# Patient Record
Sex: Female | Born: 1955
Health system: Southern US, Community
[De-identification: ages and names within clinical notes are randomized; demographics above are authoritative.]

## PROBLEM LIST (undated history)

## (undated) DIAGNOSIS — Q782 Osteopetrosis: Secondary | ICD-10-CM

## (undated) DIAGNOSIS — I10 Essential (primary) hypertension: Secondary | ICD-10-CM

## (undated) HISTORY — DX: Osteopetrosis: Q78.2

## (undated) HISTORY — DX: Essential (primary) hypertension: I10

## (undated) HISTORY — PX: TUBAL LIGATION: SHX77

## (undated) HISTORY — PX: BREAST ENHANCEMENT SURGERY: SHX7

---

## 1997-10-31 ENCOUNTER — Other Ambulatory Visit: Admission: RE | Admit: 1997-10-31 | Discharge: 1997-10-31 | Payer: Self-pay | Admitting: Gynecology

## 1997-11-28 ENCOUNTER — Other Ambulatory Visit: Admission: RE | Admit: 1997-11-28 | Discharge: 1997-11-28 | Payer: Self-pay | Admitting: Gynecology

## 1998-11-27 ENCOUNTER — Other Ambulatory Visit: Admission: RE | Admit: 1998-11-27 | Discharge: 1998-11-27 | Payer: Self-pay | Admitting: Gynecology

## 1999-12-01 ENCOUNTER — Other Ambulatory Visit: Admission: RE | Admit: 1999-12-01 | Discharge: 1999-12-01 | Payer: Self-pay | Admitting: Gynecology

## 2000-12-01 ENCOUNTER — Other Ambulatory Visit: Admission: RE | Admit: 2000-12-01 | Discharge: 2000-12-01 | Payer: Self-pay | Admitting: Gynecology

## 2001-12-11 ENCOUNTER — Other Ambulatory Visit: Admission: RE | Admit: 2001-12-11 | Discharge: 2001-12-11 | Payer: Self-pay | Admitting: Gynecology

## 2002-12-11 ENCOUNTER — Other Ambulatory Visit: Admission: RE | Admit: 2002-12-11 | Discharge: 2002-12-11 | Payer: Self-pay | Admitting: Gynecology

## 2004-01-09 ENCOUNTER — Other Ambulatory Visit: Admission: RE | Admit: 2004-01-09 | Discharge: 2004-01-09 | Payer: Self-pay | Admitting: Gynecology

## 2005-01-19 ENCOUNTER — Other Ambulatory Visit: Admission: RE | Admit: 2005-01-19 | Discharge: 2005-01-19 | Payer: Self-pay | Admitting: Gynecology

## 2006-02-11 ENCOUNTER — Ambulatory Visit (HOSPITAL_COMMUNITY): Admission: RE | Admit: 2006-02-11 | Discharge: 2006-02-11 | Payer: Self-pay | Admitting: Gynecology

## 2011-01-14 ENCOUNTER — Other Ambulatory Visit: Payer: Self-pay | Admitting: Gynecology

## 2013-09-11 ENCOUNTER — Ambulatory Visit (INDEPENDENT_AMBULATORY_CARE_PROVIDER_SITE_OTHER): Payer: BC Managed Care – PPO | Admitting: Podiatrist

## 2013-09-11 ENCOUNTER — Encounter: Payer: Self-pay | Admitting: Podiatrist

## 2013-09-11 ENCOUNTER — Ambulatory Visit (INDEPENDENT_AMBULATORY_CARE_PROVIDER_SITE_OTHER): Payer: BC Managed Care – PPO

## 2013-09-11 VITALS — BP 124/80 | HR 68 | Resp 18

## 2013-09-11 DIAGNOSIS — M722 Plantar fascial fibromatosis: Secondary | ICD-10-CM

## 2013-09-11 MED ORDER — TRIAMCINOLONE ACETONIDE 10 MG/ML IJ SUSP
10.0000 mg | Freq: Once | INTRAMUSCULAR | Status: AC
  Administered 2013-09-11: 10 mg

## 2013-09-11 NOTE — Patient Instructions (Signed)

## 2013-09-11 NOTE — Progress Notes (Signed)
   Subjective:    Patient ID: Yesenia Torres, female    DOB: Aug 27, 1955, 58 y.o.   MRN: [id number]  HPI I AM HAVING SOME HEEL PAIN ON MY LEFT FOOT AND HAS BEEN GOING ON FOR ABOUT A YEAR AND I WENT TO THE DOCTOR AND THEY DID INJECTIONS AND HAD TWO INJECTIONS AND THEY GAVE ME PRENISONE AND WORE A BOOT FOR ABOUT 5 MONTHS AND I WAS GIVEN MOBIC AND SEEMED TO HELP SOME     Review of Systems  All other systems reviewed and are negative.      Objective:   Physical Exam GENERAL APPEARANCE: Alert, conversant. Appropriately groomed. No acute distress.  VASCULAR: Pedal pulses palpable and strong bilateral.  Capillary refill time is immediate to all digits,  Proximal to distal cooling it warm to warm.  Digital hair growth is present bilateral  NEUROLOGIC: sensation is intact epicritically and protectively to 5.07 monofilament at 5/5 sites bilateral.  Light touch is intact bilateral, vibratory sensation intact bilateral, achilles tendon reflex is intact bilateral.  Negative Tinel sign is elicited MUSCULOSKELETAL: Pain on palpation plantar medial aspect left foot  at insertion of plantar fascia on the medial calcaneal tubercle. Inflammation at the insertion of the plantar fascia is present. Very thin foot and rectus foot type is seen. DERMATOLOGIC: skin color, texture, and turger are within normal limits.  No preulcerative lesions are seen, no interdigital maceration noted.  No open lesions present.  Digital nails are asymptomatic.     Assessment & Plan:  Plantar fasciitis left  Plan: Patient has tried conservative treatments courtesy of Dr. Gean Quint consisting of steroid injections x2 in September of 2014. She also tried a boot which felt good at the time of wearing it however the plantar fasciitis came back. Today I recommended another cortisone injection and this was carried out under sterile technique. I also recommended custom orthotics as well. We discussed physical therapy if the orthotics are not  beneficial at resolving the plantar fasciitis. I will see her back in one month and we will consider orthotics versus the physical therapy at that time.

## 2013-09-12 ENCOUNTER — Other Ambulatory Visit: Payer: Self-pay | Admitting: Podiatrist

## 2013-09-12 ENCOUNTER — Telehealth: Payer: Self-pay | Admitting: *Deleted

## 2013-09-12 MED ORDER — MELOXICAM 15 MG PO TABS: 15.0000 mg | ORAL_TABLET | Freq: Every day | ORAL | Status: DC

## 2013-09-12 NOTE — Telephone Encounter (Signed)
She saw me yesterday.  Will she be willing to write me a prescription for Meloxicam to go alone with the shot I got yesterday?  Someone give me a call.

## 2013-09-12 NOTE — Telephone Encounter (Signed)
Yes, please call in melixicam if it wasn't already done yesterday when I saw her #30 with 2 refills

## 2013-09-12 NOTE — Addendum Note (Signed)
Addended by: Cranford Mon R on: 09/12/2013 03:36 PM   Modules accepted: Orders

## 2014-07-01 ENCOUNTER — Other Ambulatory Visit: Payer: Self-pay | Admitting: Orthopedic Surgery

## 2014-07-01 DIAGNOSIS — R52 Pain, unspecified: Secondary | ICD-10-CM

## 2014-07-03 ENCOUNTER — Ambulatory Visit
Admission: RE | Admit: 2014-07-03 | Discharge: 2014-07-03 | Disposition: A | Discharge status: Home / Self Care | Payer: BLUE CROSS/BLUE SHIELD | Source: Ambulatory Visit | Attending: Orthopedic Surgery | Admitting: Orthopedic Surgery

## 2014-07-03 DIAGNOSIS — R52 Pain, unspecified: Secondary | ICD-10-CM

## 2014-07-16 ENCOUNTER — Other Ambulatory Visit: Payer: Self-pay | Admitting: Obstetrics & Gynecology

## 2014-07-17 LAB — CYTOLOGY - PAP

## 2014-08-13 ENCOUNTER — Other Ambulatory Visit: Payer: Self-pay | Admitting: Obstetrics & Gynecology

## 2017-06-06 ENCOUNTER — Encounter: Payer: Self-pay | Admitting: Gastroenterology

## 2017-10-10 DIAGNOSIS — Z0000 Encounter for general adult medical examination without abnormal findings: Secondary | ICD-10-CM | POA: Diagnosis not present

## 2017-10-10 DIAGNOSIS — Z01419 Encounter for gynecological examination (general) (routine) without abnormal findings: Secondary | ICD-10-CM | POA: Diagnosis not present

## 2017-10-10 DIAGNOSIS — Z1231 Encounter for screening mammogram for malignant neoplasm of breast: Secondary | ICD-10-CM | POA: Diagnosis not present

## 2017-10-10 DIAGNOSIS — Z6826 Body mass index (BMI) 26.0-26.9, adult: Secondary | ICD-10-CM | POA: Diagnosis not present

## 2017-11-30 DIAGNOSIS — L578 Other skin changes due to chronic exposure to nonionizing radiation: Secondary | ICD-10-CM | POA: Diagnosis not present

## 2017-11-30 DIAGNOSIS — L821 Other seborrheic keratosis: Secondary | ICD-10-CM | POA: Diagnosis not present

## 2017-11-30 DIAGNOSIS — C44719 Basal cell carcinoma of skin of left lower limb, including hip: Secondary | ICD-10-CM | POA: Diagnosis not present

## 2017-11-30 DIAGNOSIS — D485 Neoplasm of uncertain behavior of skin: Secondary | ICD-10-CM | POA: Diagnosis not present

## 2017-11-30 DIAGNOSIS — L820 Inflamed seborrheic keratosis: Secondary | ICD-10-CM | POA: Diagnosis not present

## 2018-08-23 DIAGNOSIS — L820 Inflamed seborrheic keratosis: Secondary | ICD-10-CM | POA: Diagnosis not present

## 2018-10-16 DIAGNOSIS — Z124 Encounter for screening for malignant neoplasm of cervix: Secondary | ICD-10-CM | POA: Diagnosis not present

## 2018-10-16 DIAGNOSIS — Z6826 Body mass index (BMI) 26.0-26.9, adult: Secondary | ICD-10-CM | POA: Diagnosis not present

## 2018-10-16 DIAGNOSIS — Z0000 Encounter for general adult medical examination without abnormal findings: Secondary | ICD-10-CM | POA: Diagnosis not present

## 2018-10-16 DIAGNOSIS — R8290 Unspecified abnormal findings in urine: Secondary | ICD-10-CM | POA: Diagnosis not present

## 2018-10-16 DIAGNOSIS — Z01419 Encounter for gynecological examination (general) (routine) without abnormal findings: Secondary | ICD-10-CM | POA: Diagnosis not present

## 2018-10-27 DIAGNOSIS — Z1231 Encounter for screening mammogram for malignant neoplasm of breast: Secondary | ICD-10-CM | POA: Diagnosis not present

## 2018-11-14 DIAGNOSIS — H9201 Otalgia, right ear: Secondary | ICD-10-CM | POA: Diagnosis not present

## 2018-11-14 DIAGNOSIS — H6121 Impacted cerumen, right ear: Secondary | ICD-10-CM | POA: Diagnosis not present

## 2018-11-16 DIAGNOSIS — H6120 Impacted cerumen, unspecified ear: Secondary | ICD-10-CM | POA: Diagnosis not present

## 2018-12-05 DIAGNOSIS — Z23 Encounter for immunization: Secondary | ICD-10-CM | POA: Diagnosis not present

## 2018-12-06 DIAGNOSIS — I10 Essential (primary) hypertension: Secondary | ICD-10-CM | POA: Diagnosis not present

## 2019-01-03 DIAGNOSIS — Z23 Encounter for immunization: Secondary | ICD-10-CM | POA: Diagnosis not present

## 2019-01-03 DIAGNOSIS — I10 Essential (primary) hypertension: Secondary | ICD-10-CM | POA: Diagnosis not present

## 2019-01-03 DIAGNOSIS — Z1331 Encounter for screening for depression: Secondary | ICD-10-CM | POA: Diagnosis not present

## 2019-01-03 DIAGNOSIS — Z87891 Personal history of nicotine dependence: Secondary | ICD-10-CM | POA: Diagnosis not present

## 2019-01-31 DIAGNOSIS — I10 Essential (primary) hypertension: Secondary | ICD-10-CM | POA: Diagnosis not present

## 2019-05-02 DIAGNOSIS — I10 Essential (primary) hypertension: Secondary | ICD-10-CM | POA: Diagnosis not present

## 2019-06-27 DIAGNOSIS — B029 Zoster without complications: Secondary | ICD-10-CM | POA: Diagnosis not present

## 2019-07-28 DIAGNOSIS — I10 Essential (primary) hypertension: Secondary | ICD-10-CM | POA: Diagnosis not present

## 2019-07-28 DIAGNOSIS — R7301 Impaired fasting glucose: Secondary | ICD-10-CM | POA: Diagnosis not present

## 2019-07-28 DIAGNOSIS — Z6825 Body mass index (BMI) 25.0-25.9, adult: Secondary | ICD-10-CM | POA: Diagnosis not present

## 2019-07-28 DIAGNOSIS — E871 Hypo-osmolality and hyponatremia: Secondary | ICD-10-CM | POA: Diagnosis not present

## 2019-10-22 DIAGNOSIS — Z6825 Body mass index (BMI) 25.0-25.9, adult: Secondary | ICD-10-CM | POA: Diagnosis not present

## 2019-10-22 DIAGNOSIS — Z01419 Encounter for gynecological examination (general) (routine) without abnormal findings: Secondary | ICD-10-CM | POA: Diagnosis not present

## 2019-10-29 DIAGNOSIS — I10 Essential (primary) hypertension: Secondary | ICD-10-CM | POA: Diagnosis not present

## 2019-10-29 DIAGNOSIS — M8589 Other specified disorders of bone density and structure, multiple sites: Secondary | ICD-10-CM | POA: Diagnosis not present

## 2019-10-29 DIAGNOSIS — R7301 Impaired fasting glucose: Secondary | ICD-10-CM | POA: Diagnosis not present

## 2019-10-29 DIAGNOSIS — E871 Hypo-osmolality and hyponatremia: Secondary | ICD-10-CM | POA: Diagnosis not present

## 2019-11-02 DIAGNOSIS — Z1231 Encounter for screening mammogram for malignant neoplasm of breast: Secondary | ICD-10-CM | POA: Diagnosis not present

## 2019-11-26 DIAGNOSIS — R6889 Other general symptoms and signs: Secondary | ICD-10-CM | POA: Diagnosis not present

## 2019-12-24 DIAGNOSIS — Z20822 Contact with and (suspected) exposure to covid-19: Secondary | ICD-10-CM | POA: Diagnosis not present

## 2019-12-24 DIAGNOSIS — Z1152 Encounter for screening for COVID-19: Secondary | ICD-10-CM | POA: Diagnosis not present

## 2020-01-11 DIAGNOSIS — M25542 Pain in joints of left hand: Secondary | ICD-10-CM | POA: Diagnosis not present

## 2020-01-14 DIAGNOSIS — M25542 Pain in joints of left hand: Secondary | ICD-10-CM | POA: Diagnosis not present

## 2020-04-30 ENCOUNTER — Other Ambulatory Visit: Payer: Self-pay | Admitting: Internal Medicine

## 2020-04-30 DIAGNOSIS — E871 Hypo-osmolality and hyponatremia: Secondary | ICD-10-CM | POA: Diagnosis not present

## 2020-04-30 DIAGNOSIS — M8589 Other specified disorders of bone density and structure, multiple sites: Secondary | ICD-10-CM

## 2020-04-30 DIAGNOSIS — I10 Essential (primary) hypertension: Secondary | ICD-10-CM | POA: Diagnosis not present

## 2020-04-30 DIAGNOSIS — Z1331 Encounter for screening for depression: Secondary | ICD-10-CM | POA: Diagnosis not present

## 2020-04-30 DIAGNOSIS — Z23 Encounter for immunization: Secondary | ICD-10-CM | POA: Diagnosis not present

## 2020-04-30 DIAGNOSIS — Z13220 Encounter for screening for lipoid disorders: Secondary | ICD-10-CM | POA: Diagnosis not present

## 2020-04-30 DIAGNOSIS — R7301 Impaired fasting glucose: Secondary | ICD-10-CM | POA: Diagnosis not present

## 2020-05-14 DIAGNOSIS — S338XXA Sprain of other parts of lumbar spine and pelvis, initial encounter: Secondary | ICD-10-CM | POA: Diagnosis not present

## 2020-05-14 DIAGNOSIS — S134XXA Sprain of ligaments of cervical spine, initial encounter: Secondary | ICD-10-CM | POA: Diagnosis not present

## 2020-05-16 DIAGNOSIS — S338XXA Sprain of other parts of lumbar spine and pelvis, initial encounter: Secondary | ICD-10-CM | POA: Diagnosis not present

## 2020-05-16 DIAGNOSIS — S134XXA Sprain of ligaments of cervical spine, initial encounter: Secondary | ICD-10-CM | POA: Diagnosis not present

## 2020-06-04 DIAGNOSIS — S134XXA Sprain of ligaments of cervical spine, initial encounter: Secondary | ICD-10-CM | POA: Diagnosis not present

## 2020-06-04 DIAGNOSIS — S338XXA Sprain of other parts of lumbar spine and pelvis, initial encounter: Secondary | ICD-10-CM | POA: Diagnosis not present

## 2020-06-11 DIAGNOSIS — S134XXA Sprain of ligaments of cervical spine, initial encounter: Secondary | ICD-10-CM | POA: Diagnosis not present

## 2020-06-11 DIAGNOSIS — S338XXA Sprain of other parts of lumbar spine and pelvis, initial encounter: Secondary | ICD-10-CM | POA: Diagnosis not present

## 2020-06-13 DIAGNOSIS — S338XXA Sprain of other parts of lumbar spine and pelvis, initial encounter: Secondary | ICD-10-CM | POA: Diagnosis not present

## 2020-06-13 DIAGNOSIS — S134XXA Sprain of ligaments of cervical spine, initial encounter: Secondary | ICD-10-CM | POA: Diagnosis not present

## 2020-06-20 DIAGNOSIS — S338XXA Sprain of other parts of lumbar spine and pelvis, initial encounter: Secondary | ICD-10-CM | POA: Diagnosis not present

## 2020-06-20 DIAGNOSIS — S134XXA Sprain of ligaments of cervical spine, initial encounter: Secondary | ICD-10-CM | POA: Diagnosis not present

## 2020-07-04 DIAGNOSIS — S134XXA Sprain of ligaments of cervical spine, initial encounter: Secondary | ICD-10-CM | POA: Diagnosis not present

## 2020-07-04 DIAGNOSIS — S338XXA Sprain of other parts of lumbar spine and pelvis, initial encounter: Secondary | ICD-10-CM | POA: Diagnosis not present

## 2020-07-18 DIAGNOSIS — S338XXA Sprain of other parts of lumbar spine and pelvis, initial encounter: Secondary | ICD-10-CM | POA: Diagnosis not present

## 2020-07-18 DIAGNOSIS — S134XXA Sprain of ligaments of cervical spine, initial encounter: Secondary | ICD-10-CM | POA: Diagnosis not present

## 2020-08-01 DIAGNOSIS — S134XXA Sprain of ligaments of cervical spine, initial encounter: Secondary | ICD-10-CM | POA: Diagnosis not present

## 2020-08-01 DIAGNOSIS — S338XXA Sprain of other parts of lumbar spine and pelvis, initial encounter: Secondary | ICD-10-CM | POA: Diagnosis not present

## 2020-08-07 ENCOUNTER — Encounter: Payer: Self-pay | Admitting: Gastroenterology

## 2020-08-13 DIAGNOSIS — S338XXA Sprain of other parts of lumbar spine and pelvis, initial encounter: Secondary | ICD-10-CM | POA: Diagnosis not present

## 2020-08-13 DIAGNOSIS — S134XXA Sprain of ligaments of cervical spine, initial encounter: Secondary | ICD-10-CM | POA: Diagnosis not present

## 2020-08-19 DIAGNOSIS — M810 Age-related osteoporosis without current pathological fracture: Secondary | ICD-10-CM | POA: Diagnosis not present

## 2020-08-19 DIAGNOSIS — M8589 Other specified disorders of bone density and structure, multiple sites: Secondary | ICD-10-CM | POA: Diagnosis not present

## 2020-08-19 DIAGNOSIS — Z780 Asymptomatic menopausal state: Secondary | ICD-10-CM | POA: Diagnosis not present

## 2020-08-26 DIAGNOSIS — M810 Age-related osteoporosis without current pathological fracture: Secondary | ICD-10-CM | POA: Diagnosis not present

## 2020-08-26 DIAGNOSIS — M545 Low back pain, unspecified: Secondary | ICD-10-CM | POA: Diagnosis not present

## 2020-08-26 DIAGNOSIS — Z6825 Body mass index (BMI) 25.0-25.9, adult: Secondary | ICD-10-CM | POA: Diagnosis not present

## 2020-08-26 DIAGNOSIS — G8929 Other chronic pain: Secondary | ICD-10-CM | POA: Diagnosis not present

## 2020-09-04 ENCOUNTER — Ambulatory Visit
Admission: RE | Admit: 2020-09-04 | Discharge: 2020-09-04 | Disposition: A | Discharge status: Home / Self Care | Payer: BLUE CROSS/BLUE SHIELD | Source: Ambulatory Visit | Attending: Internal Medicine | Admitting: Internal Medicine

## 2020-09-04 ENCOUNTER — Other Ambulatory Visit: Payer: Self-pay

## 2020-09-04 ENCOUNTER — Other Ambulatory Visit: Payer: Self-pay | Admitting: Internal Medicine

## 2020-09-04 DIAGNOSIS — R52 Pain, unspecified: Secondary | ICD-10-CM

## 2020-09-04 DIAGNOSIS — M545 Low back pain, unspecified: Secondary | ICD-10-CM | POA: Diagnosis not present

## 2020-09-22 ENCOUNTER — Encounter: Payer: Self-pay | Admitting: Gastroenterology

## 2020-09-22 ENCOUNTER — Ambulatory Visit (INDEPENDENT_AMBULATORY_CARE_PROVIDER_SITE_OTHER): Payer: BLUE CROSS/BLUE SHIELD | Admitting: Gastroenterology

## 2020-09-22 VITALS — BP 128/72 | HR 64

## 2020-09-22 DIAGNOSIS — Z86010 Personal history of colonic polyps: Secondary | ICD-10-CM

## 2020-09-22 MED ORDER — NA SULFATE-K SULFATE-MG SULF 17.5-3.13-1.6 GM/177ML PO SOLN: 1.0000 | Freq: Once | ORAL | 0 refills | Status: AC

## 2020-09-22 NOTE — Patient Instructions (Addendum)
If you are age 64 or older, your body mass index should be between 23-30. Your There is no height or weight on file to calculate BMI. If this is out of the aforementioned range listed, please consider follow up with your Primary Care Provider.  If you are age 70 or younger, your body mass index should be between 19-25. Your There is no height or weight on file to calculate BMI. If this is out of the aformentioned range listed, please consider follow up with your Primary Care Provider.   __________________________________________________________  The [facility] providers would like to encourage you to use [facility] to communicate with providers for non-urgent requests or questions.  Due to long hold times on the telephone, sending your provider a message by [facility] may be a faster and more efficient way to get a response.  Please allow 48 business hours for a response.  Please remember that this is for non-urgent requests.   You have been scheduled for a colonoscopy. Please follow written instructions given to you at your visit today.  Please pick up your prep supplies at the pharmacy within the next 1-3 days. If you use inhalers (even only as needed), please bring them with you on the day of your procedure.  Please call with any questions or concerns.  Thank you,  Dr. Jackquline Denmark

## 2020-09-22 NOTE — Progress Notes (Signed)
Chief Complaint: For colon  Referring Provider:  Dr Ilda Basset      ASSESSMENT AND PLAN;   #1. High risk colorectal cancer screening. (H/O tubular adenomas)  #2. Rectal Bleeding (likely d/t hoids)- Resolved.  Plan:  - Proceed with colonoscopy. Discussed risks & benefits. Risks including rare perforation req laparotomy, bleeding after bx/polypectomy req blood transfusion, rarely missing neoplasms, risks of anesthesia/sedation, rare risk of damage to internal organs. Benefits outweigh the risks. Patient agrees to proceed. All the questions were answered. Pt consents to proceed.  -Blood work from Dr Delena Bali.    HPI:    Yesenia Torres is a 65 y.o. female   Here for colonoscopy.  No nausea, vomiting, heartburn, regurgitation, odynophagia or dysphagia.  No significant diarrhea or constipation.  No melena. No unintentional weight loss. No abdominal pain.  Occ intermittent painless rectal bleeding x 6 mths, resolved now.  Attributed to hemorrhoids.  No sodas, chocolates, chewing gums, artificial sweeteners and candy. No NSAIDs  S/P D&C d/t DUB (Had uterine fibroids) 1994.  FH-mother with history of Crohn's disease.  Previous GI work-up: Colonoscopy 04/2014 ([facility]): Colonic polyp s/p snare polypectomy, mild sigmoid diverticulosis, small internal and external hemorrhoids. Bx- TA. Rpt 5 yrs. Past Medical History:  Diagnosis Date   High blood pressure    Osteopetrosis     Past Surgical History:  Procedure Laterality Date   BREAST ENHANCEMENT SURGERY     TUBAL LIGATION      Family History  Problem Relation Age of Onset   Crohn's disease Mother    Diabetes Mother    Ulcerative colitis Brother    Diabetes Brother    Pancreatic cancer Paternal Grandmother    Colon cancer Neg Hx    Esophageal cancer Neg Hx    Liver disease Neg Hx    Stomach cancer Neg Hx     Social History   Tobacco Use   Smoking status: Some Days    Pack years: 0.00    Types: Cigarettes    Smokeless tobacco: Never  Vaping Use   Vaping Use: Never used  Substance Use Topics   Alcohol use: Yes   Drug use: Never    Current Outpatient Medications  Medication Sig Dispense Refill   alendronate (FOSAMAX) 70 MG tablet Take 70 mg by mouth once a week.     Calcium Carb-Cholecalciferol (CALCIUM 500/D) 500-400 MG-UNIT CHEW Chew 1 tablet by mouth daily.     Cholecalciferol (VITAMIN D3) 125 MCG (5000 UT) TABS Take 1 tablet by mouth daily.     olmesartan-hydrochlorothiazide (BENICAR HCT) 40-12.5 MG tablet Take 1 tablet by mouth daily.     No current facility-administered medications for this visit.    No Known Allergies  Review of Systems:  Constitutional: Denies fever, chills, diaphoresis, appetite change and fatigue.  HEENT: Denies photophobia, eye pain, redness, hearing loss, ear pain, congestion, sore throat, rhinorrhea, sneezing, mouth sores, neck pain, neck stiffness and tinnitus.   Respiratory: Denies SOB, DOE, cough, chest tightness,  and wheezing.   Cardiovascular: Denies chest pain, palpitations and leg swelling.  Genitourinary: Denies dysuria, urgency, frequency, hematuria, flank pain and difficulty urinating.  Musculoskeletal: Denies myalgias, back pain, joint swelling, arthralgias and gait problem.  Skin: No rash.  Neurological: Denies dizziness, seizures, syncope, weakness, light-headedness, numbness and headaches.  Hematological: Denies adenopathy. Easy bruising, personal or family bleeding history  Psychiatric/Behavioral: No anxiety or depression     Physical Exam:    BP 128/72   Pulse 64  SpO2 99%  Wt Readings from Last 3 Encounters:  No data found for Wt   Constitutional:  Well-developed, in no acute distress. Psychiatric: Normal mood and affect. Behavior is normal. HEENT: Pupils normal.  Conjunctivae are normal. No scleral icterus. Cardiovascular: Normal rate, regular rhythm. No edema Pulmonary/chest: Effort normal and breath sounds normal. No  wheezing, rales or rhonchi. Abdominal: Soft, nondistended. Nontender. Bowel sounds active throughout. There are no masses palpable. No hepatomegaly. Rectal: Deferred.  To be performed at the time of colonoscopy Neurological: Alert and oriented to person place and time. Skin: Skin is warm and dry. No rashes noted.    Carmell Austria, MD 09/22/2020, 9:24 AM  Cc: Dr Ilda Basset

## 2020-09-30 ENCOUNTER — Encounter: Payer: Self-pay | Admitting: Gastroenterology

## 2020-10-14 ENCOUNTER — Other Ambulatory Visit: Payer: Self-pay

## 2020-10-14 ENCOUNTER — Encounter: Payer: Self-pay | Admitting: Gastroenterology

## 2020-10-14 ENCOUNTER — Ambulatory Visit (AMBULATORY_SURGERY_CENTER): Payer: BLUE CROSS/BLUE SHIELD | Admitting: Gastroenterology

## 2020-10-14 VITALS — BP 119/71 | HR 64 | Temp 97.3°F | Resp 11 | Ht 63.0 in | Wt 140.0 lb

## 2020-10-14 DIAGNOSIS — D128 Benign neoplasm of rectum: Secondary | ICD-10-CM

## 2020-10-14 DIAGNOSIS — Z86010 Personal history of colonic polyps: Secondary | ICD-10-CM | POA: Diagnosis not present

## 2020-10-14 DIAGNOSIS — D125 Benign neoplasm of sigmoid colon: Secondary | ICD-10-CM | POA: Diagnosis not present

## 2020-10-14 DIAGNOSIS — D124 Benign neoplasm of descending colon: Secondary | ICD-10-CM

## 2020-10-14 DIAGNOSIS — D127 Benign neoplasm of rectosigmoid junction: Secondary | ICD-10-CM | POA: Diagnosis not present

## 2020-10-14 DIAGNOSIS — Z1211 Encounter for screening for malignant neoplasm of colon: Secondary | ICD-10-CM | POA: Diagnosis not present

## 2020-10-14 MED ORDER — SODIUM CHLORIDE 0.9 % IV SOLN: 500.0000 mL | Freq: Once | INTRAVENOUS | Status: DC

## 2020-10-14 MED ORDER — HYDROCORTISONE (PERIANAL) 2.5 % EX CREA: 1.0000 "application " | TOPICAL_CREAM | Freq: Two times a day (BID) | CUTANEOUS | 2 refills | Status: DC

## 2020-10-14 NOTE — Progress Notes (Signed)
Medical history reviewed with no changes noted. VS assessed by C.W 

## 2020-10-14 NOTE — Progress Notes (Signed)
pt tolerated well. VSS. awake and to recovery. Report given to RN.  

## 2020-10-14 NOTE — Progress Notes (Signed)
Called to room to assist during endoscopic procedure.  Patient ID and intended procedure confirmed with present staff. Received instructions for my participation in the procedure from the performing physician.  

## 2020-10-14 NOTE — Patient Instructions (Signed)
YOU HAD AN ENDOSCOPIC PROCEDURE TODAY AT [facility]:   Refer to the procedure report that was given to you for any specific questions about what was found during the examination.  If the procedure report does not answer your questions, please call your gastroenterologist to clarify.  If you requested that your care partner not be given the details of your procedure findings, then the procedure report has been included in a sealed envelope for you to review at your convenience later.  YOU SHOULD EXPECT: Some feelings of bloating in the abdomen. Passage of more gas than usual.  Walking can help get rid of the air that was put into your GI tract during the procedure and reduce the bloating. If you had a lower endoscopy (such as a colonoscopy or flexible sigmoidoscopy) you may notice spotting of blood in your stool or on the toilet paper. If you underwent a bowel prep for your procedure, you may not have a normal bowel movement for a few days.   **Handouts given on polyps and hemorrhoids**  Please Note:  You might notice some irritation and congestion in your nose or some drainage.  This is from the oxygen used during your procedure.  There is no need for concern and it should clear up in a day or so.  SYMPTOMS TO REPORT IMMEDIATELY:  Following lower endoscopy (colonoscopy or flexible sigmoidoscopy):  Excessive amounts of blood in the stool  Significant tenderness or worsening of abdominal pains  Swelling of the abdomen that is new, acute  Fever of 100F or higher  For urgent or emergent issues, a gastroenterologist can be reached at any hour by calling [phone]. Do not use MyChart messaging for urgent concerns.    DIET:  We do recommend a small meal at first, but then you may proceed to your regular diet.  Drink plenty of fluids but you should avoid alcoholic beverages for 24 hours.  ACTIVITY:  You should plan to take it easy for the rest of today and you should NOT DRIVE  or use heavy machinery until tomorrow (because of the sedation medicines used during the test).    FOLLOW UP: Our staff will call the number listed on your records 48-72 hours following your procedure to check on you and address any questions or concerns that you may have regarding the information given to you following your procedure. If we do not reach you, we will leave a message.  We will attempt to reach you two times.  During this call, we will ask if you have developed any symptoms of COVID 19. If you develop any symptoms (ie: fever, flu-like symptoms, shortness of breath, cough etc.) before then, please call [phone].  If you test positive for Covid 19 in the 2 weeks post procedure, please call and report this information to [place].    If any biopsies were taken you will be contacted by phone or by letter within the next 1-3 weeks.  Please call us at [phone] if you have not heard about the biopsies in 3 weeks.    SIGNATURES/CONFIDENTIALITY: You and/or your care partner have signed paperwork which will be entered into your electronic medical record.  These signatures attest to the fact that that the information above on your After Visit Summary has been reviewed and is understood.  Full responsibility of the confidentiality of this discharge information lies with you and/or your care-partner.

## 2020-10-14 NOTE — Op Note (Signed)
[place] Patient Name: Yesenia Torres Procedure Date: 10/14/2020 8:27 AM MRN: [id number] Endoscopist: Jackquline Denmark , MD Age: 65 Referring MD:  Date of Birth: Jul 18, 1955 Gender: Female Account #: [id number] Procedure:                Colonoscopy Indications:              High risk colon cancer surveillance: Personal                            history of colonic polyps Medicines:                Monitored Anesthesia Care Procedure:                Pre-Anesthesia Assessment:                           - Prior to the procedure, a History and Physical                            was performed, and patient medications and                            allergies were reviewed. The patient's tolerance of                            previous anesthesia was also reviewed. The risks                            and benefits of the procedure and the sedation                            options and risks were discussed with the patient.                            All questions were answered, and informed consent                            was obtained. Prior Anticoagulants: The patient has                            taken no previous anticoagulant or antiplatelet                            agents. ASA Grade Assessment: II - A patient with                            mild systemic disease. After reviewing the risks                            and benefits, the patient was deemed in                            satisfactory condition to undergo the procedure.  After obtaining informed consent, the colonoscope                            was passed under direct vision. Throughout the                            procedure, the patient's blood pressure, pulse, and                            oxygen saturations were monitored continuously. The                            Olympus [id number]) Colonoscope was                            introduced through the anus and advanced to the  2                            cm into the ileum. The colonoscopy was performed                            without difficulty. The patient tolerated the                            procedure well. The quality of the bowel                            preparation was good. The terminal ileum, ileocecal                            valve, appendiceal orifice, and rectum were                            photographed. Scope In: 8:29:40 AM Scope Out: 8:47:35 AM Scope Withdrawal Time: 0 hours 12 minutes 42 seconds  Total Procedure Duration: 0 hours 17 minutes 55 seconds  Findings:                 Three sessile polyps were found in the distal                            rectum, mid sigmoid colon and mid descending colon.                            The polyps were 4 to 6 mm in size. These polyps                            were removed with a cold snare. Resection and                            retrieval were complete. Estimated blood loss: none.                           A few small-mouthed diverticula were found  in the                            sigmoid colon.                           Non-bleeding internal hemorrhoids were found during                            retroflexion and during perianal exam. The                            hemorrhoids were small. However, had red wale                            markings suggestive of recent bleeding. There was                            no active bleeding.                           The terminal ileum appeared normal.                           The exam was otherwise without abnormality on                            direct and retroflexion views. Complications:            No immediate complications. Estimated Blood Loss:     Estimated blood loss: none. Impression:               - Three 4 to 6 mm polyps in the distal rectum, in                            the mid sigmoid colon and in the mid descending                            colon, removed with a cold snare.  Resected and                            retrieved.                           - Mild sigmoid diverticulosis.                           - Non-bleeding internal hemorrhoids.                           - The examined portion of the ileum was normal.                           - The examination was otherwise normal on direct                            and retroflexion  views. Recommendation:           - Patient has a contact number available for                            emergencies. The signs and symptoms of potential                            delayed complications were discussed with the                            patient. Return to normal activities tomorrow.                            Written discharge instructions were provided to the                            patient.                           - High fiber diet.                           - Continue present medications.                           - Await pathology results.                           - Repeat colonoscopy for surveillance based on                            pathology results.                           - Use HC Cream 2.5%: Apply externally BID for 10                            days. #2 refills.                           - If continues to have hemorrhoidal problems, would                            recommend banding                           - The findings and recommendations were discussed                            with the patient's husband Yesenia Torres. Jackquline Denmark, MD 10/14/2020 8:53:31 AM This report has been signed electronically.

## 2020-10-16 ENCOUNTER — Telehealth: Payer: Self-pay

## 2020-10-16 ENCOUNTER — Telehealth: Payer: Self-pay | Admitting: *Deleted

## 2020-10-16 NOTE — Telephone Encounter (Signed)
Attempted f/u call back. No answer, left VM. 

## 2020-10-16 NOTE — Telephone Encounter (Signed)
  Follow up Call-  Call back number 10/14/2020  Post procedure Call Back phone  # [phone]  Permission to leave phone message Yes  Some recent data might be hidden     Patient questions:  Do you have a fever, pain , or abdominal swelling? No. Pain Score  0 *  Have you tolerated food without any problems? Yes.    Have you been able to return to your normal activities? Yes.    Do you have any questions about your discharge instructions: Diet   No. Medications  No. Follow up visit  No.  Do you have questions or concerns about your Care? No.  Actions: * If pain score is 4 or above: No action needed, pain <4.  Have you developed a fever since your procedure? no  2.   Have you had an respiratory symptoms (SOB or cough) since your procedure? no  3.   Have you tested positive for COVID 19 since your procedure no  4.   Have you had any family members/close contacts diagnosed with the COVID 19 since your procedure?  no   If yes to any of these questions please route to Joylene John, RN and Joella Prince, RN

## 2020-10-21 ENCOUNTER — Encounter: Payer: Self-pay | Admitting: Gastroenterology

## 2020-10-28 DIAGNOSIS — Z13220 Encounter for screening for lipoid disorders: Secondary | ICD-10-CM | POA: Diagnosis not present

## 2020-10-28 DIAGNOSIS — R7301 Impaired fasting glucose: Secondary | ICD-10-CM | POA: Diagnosis not present

## 2020-10-28 DIAGNOSIS — I10 Essential (primary) hypertension: Secondary | ICD-10-CM | POA: Diagnosis not present

## 2020-10-28 DIAGNOSIS — E871 Hypo-osmolality and hyponatremia: Secondary | ICD-10-CM | POA: Diagnosis not present

## 2020-11-04 DIAGNOSIS — Z6825 Body mass index (BMI) 25.0-25.9, adult: Secondary | ICD-10-CM | POA: Diagnosis not present

## 2020-11-04 DIAGNOSIS — Z01419 Encounter for gynecological examination (general) (routine) without abnormal findings: Secondary | ICD-10-CM | POA: Diagnosis not present

## 2020-11-04 DIAGNOSIS — Z124 Encounter for screening for malignant neoplasm of cervix: Secondary | ICD-10-CM | POA: Diagnosis not present

## 2020-11-11 DIAGNOSIS — Z1231 Encounter for screening mammogram for malignant neoplasm of breast: Secondary | ICD-10-CM | POA: Diagnosis not present

## 2020-12-09 ENCOUNTER — Encounter: Payer: Self-pay | Admitting: Gastroenterology

## 2020-12-09 NOTE — Telephone Encounter (Signed)
Pt called stating that she received a bill for her procedure. She stated that Dr. Lyndel Safe told her that her procedure was going to be coded in a way so she does not have to pay her deductible. Pt is inquiring if her procedure was coded incorrectly. Pls call her.

## 2021-01-21 NOTE — Progress Notes (Signed)
01/21/2021 Yesenia Torres [id number] 12/01/1955   Chief Complaint: Hemorrhoids, rectal bleeding   History of Present Illness: Yesenia Torres is a 65 year old female with a past medical history of hypertension, osteoporosis and colon polyps.  She underwent a colonoscopy by Dr. Lyndel Safe 11/01/2020 which identified small internal hemorrhoids and 4 to 6 mm polyps removed from the distal rectum, sigmoid and ascending colon.  Biopsies were consistent with 2 adenomatous polyps and 1 hyperplastic polyp.  Dr. Lyndel Safe recommended future hemorrhoid banding if she continued to have persistent or worsening hemorrhoidal bleeding.  She presents to our office today to discuss possible internal hemorrhoid banding.  She reported having rectal bleeding which she attributed to having hemorrhoids which was fairly consistent for 1 month prior to her colonoscopy.  Following her colonoscopy, she had less constipation and her rectal bleeding also decreased significantly.  She reported seeing a small amount of bright red blood on the toilet tissue on approximately 5 occasions since her colonoscopy.  She is passing a normal soft formed brown bowel movement daily without straining.  Prior to her colonoscopy 10/2020, she often strained to pass a BM.  She stopped taking a probiotic a few months ago.  No abdominal pain.  Her appetite is good.  Her weight is stable.  No other complaints today.  Colonoscopy 11/01/2020: - Three 4 to 6 mm polyps in the distal rectum, in the mid sigmoid colon and in the mid descending colon, removed with a cold snare. Resected and retrieved. - Mild sigmoid diverticulosis. - Non-bleeding internal hemorrhoids, however, red wale markings were identified suggestive of recent bleeding - The examined portion of the ileum was normal. - The examination was otherwise normal on direct and retroflexion views. - 5 year colonoscopy recall  - Recommended hemorrhoid banding  Surgical [P], colon, sigmoid and  descending, rectum, polyp (3) - TUBULAR ADENOMA (X2 FRAGMENTS). - HYPERPLASTIC POLYP. - NO HIGH GRADE DYSPLASIA OR MALIGNANCY.  Current Outpatient Medications on File Prior to Visit  Medication Sig Dispense Refill   acetaminophen (TYLENOL) 500 MG tablet Take 500 mg by mouth every 6 (six) hours as needed.     alendronate (FOSAMAX) 70 MG tablet Take 70 mg by mouth once a week.     Calcium Carb-Cholecalciferol (CALCIUM 500/D) 500-400 MG-UNIT CHEW Chew 1 tablet by mouth daily.     Cholecalciferol (VITAMIN D3) 125 MCG (5000 UT) TABS Take 1 tablet by mouth daily.     hydrocortisone (ANUSOL-HC) 2.5 % rectal cream Place 1 application rectally 2 (two) times daily. 30 g 2   olmesartan-hydrochlorothiazide (BENICAR HCT) 40-12.5 MG tablet Take 1 tablet by mouth daily.     No current facility-administered medications on file prior to visit.   No Known Allergies   Current Medications, Allergies, Past Medical History, Past Surgical History, Family History and Social History were reviewed in [employer] record.  Review of Systems:   Constitutional: Negative for fever, sweats, chills or weight loss.  Respiratory: Negative for shortness of breath.   Cardiovascular: Negative for chest pain, palpitations and leg swelling.  Gastrointestinal: See HPI.  Musculoskeletal: Negative for back pain or muscle aches.  Neurological: Negative for dizziness, headaches or paresthesias.    Physical Exam: BP 114/68   Pulse 67   Ht 5\' 3"  (1.6 m)   Wt 144 lb (65.3 kg)   SpO2 98%   BMI 25.51 kg/m   Patient declined exam, elected to proceed with discussion only regarding hemorrhoid management  General: 65 year old female in NAD. Neurological: Alert oriented x 4.  Psychological: Alert and cooperative. Normal mood and affect  Assessment and Recommendations:  58) 65 year old female with internal hemorrhoids with intermittent bleeding associated with constipation which has significantly  improved -Constipation management discussed, fiber diet, drink 6 to 8 glasses of water daily, Benefiber as tolerated and MiraLAX as needed -Apply a small amount of Desitin inside the anal opening and to the external anal area tid as needed for anal or hemorrhoidal irritation/bleeding.  -I did not recommend internal hemorrhoid banding today as the patient's symptoms significantly improved since her colonoscopy 10/2020.  I advised the patient to contact our office if she develops any hemorrhoidal swelling/bleeding and internal hemorrhoid banding to be considered at that time.   2) History of tubular adenomatous and hyperplastic polyps per colonoscopy 10/2020 -Next colonoscopy due 10/2025

## 2021-01-22 ENCOUNTER — Ambulatory Visit: Payer: BLUE CROSS/BLUE SHIELD | Admitting: Nurse Practitioner

## 2021-01-22 ENCOUNTER — Encounter: Payer: Self-pay | Admitting: Nurse Practitioner

## 2021-01-22 VITALS — BP 114/68 | HR 67 | Ht 63.0 in | Wt 144.0 lb

## 2021-01-22 DIAGNOSIS — K625 Hemorrhage of anus and rectum: Secondary | ICD-10-CM | POA: Diagnosis not present

## 2021-01-22 DIAGNOSIS — K648 Other hemorrhoids: Secondary | ICD-10-CM | POA: Diagnosis not present

## 2021-01-22 DIAGNOSIS — K5900 Constipation, unspecified: Secondary | ICD-10-CM

## 2021-01-22 NOTE — Patient Instructions (Addendum)
If you are age 64 or younger, your body mass index should be between 19-25. Your Body mass index is 25.51 kg/m. If this is out of the aformentioned range listed, please consider follow up with your Primary Care Provider.   The [facility] providers would like to encourage you to use [facility] to communicate with providers for non-urgent requests or questions.  Due to long hold times on the telephone, sending your provider a message by [facility] may be faster and more efficient way to get a response. Please allow 48 business hours for a response.  Please remember that this is for non-urgent requests/questions.  RECOMMENDATIONS: Desitin: Apply a small amount to the external and internal anal area three times a day as needed. Benefiber- 1 tablespoon daily. Miralax- Dissolve one capful in 8 ounces of water and drink before bed as needed.  Please call our office if your symptoms worsen.  It was great seeing you today! Thank you for entrusting me with your care and choosing [facility].  Noralyn Pick, CRNP

## 2021-01-30 NOTE — Progress Notes (Signed)
Agree with assessment and plan °RG °

## 2021-04-14 DIAGNOSIS — L820 Inflamed seborrheic keratosis: Secondary | ICD-10-CM | POA: Diagnosis not present

## 2021-04-14 DIAGNOSIS — D225 Melanocytic nevi of trunk: Secondary | ICD-10-CM | POA: Diagnosis not present

## 2021-04-14 DIAGNOSIS — L821 Other seborrheic keratosis: Secondary | ICD-10-CM | POA: Diagnosis not present

## 2021-04-14 DIAGNOSIS — D485 Neoplasm of uncertain behavior of skin: Secondary | ICD-10-CM | POA: Diagnosis not present

## 2021-04-30 DIAGNOSIS — Z13220 Encounter for screening for lipoid disorders: Secondary | ICD-10-CM | POA: Diagnosis not present

## 2021-04-30 DIAGNOSIS — R7301 Impaired fasting glucose: Secondary | ICD-10-CM | POA: Diagnosis not present

## 2021-04-30 DIAGNOSIS — M810 Age-related osteoporosis without current pathological fracture: Secondary | ICD-10-CM | POA: Diagnosis not present

## 2021-04-30 DIAGNOSIS — E871 Hypo-osmolality and hyponatremia: Secondary | ICD-10-CM | POA: Diagnosis not present

## 2021-04-30 DIAGNOSIS — Z23 Encounter for immunization: Secondary | ICD-10-CM | POA: Diagnosis not present

## 2021-04-30 DIAGNOSIS — I10 Essential (primary) hypertension: Secondary | ICD-10-CM | POA: Diagnosis not present

## 2021-05-25 DIAGNOSIS — L570 Actinic keratosis: Secondary | ICD-10-CM | POA: Diagnosis not present

## 2021-10-28 DIAGNOSIS — I10 Essential (primary) hypertension: Secondary | ICD-10-CM | POA: Diagnosis not present

## 2021-10-28 DIAGNOSIS — R7301 Impaired fasting glucose: Secondary | ICD-10-CM | POA: Diagnosis not present

## 2021-10-28 DIAGNOSIS — E871 Hypo-osmolality and hyponatremia: Secondary | ICD-10-CM | POA: Diagnosis not present

## 2021-10-28 DIAGNOSIS — M810 Age-related osteoporosis without current pathological fracture: Secondary | ICD-10-CM | POA: Diagnosis not present

## 2021-11-02 DIAGNOSIS — I10 Essential (primary) hypertension: Secondary | ICD-10-CM | POA: Diagnosis not present

## 2021-11-10 DIAGNOSIS — Z01419 Encounter for gynecological examination (general) (routine) without abnormal findings: Secondary | ICD-10-CM | POA: Diagnosis not present

## 2021-11-10 DIAGNOSIS — N959 Unspecified menopausal and perimenopausal disorder: Secondary | ICD-10-CM | POA: Diagnosis not present

## 2021-11-10 DIAGNOSIS — N905 Atrophy of vulva: Secondary | ICD-10-CM | POA: Diagnosis not present

## 2021-11-10 DIAGNOSIS — Z6824 Body mass index (BMI) 24.0-24.9, adult: Secondary | ICD-10-CM | POA: Diagnosis not present

## 2021-11-12 DIAGNOSIS — Z1231 Encounter for screening mammogram for malignant neoplasm of breast: Secondary | ICD-10-CM | POA: Diagnosis not present

## 2021-12-01 DIAGNOSIS — L821 Other seborrheic keratosis: Secondary | ICD-10-CM | POA: Diagnosis not present

## 2021-12-01 DIAGNOSIS — L814 Other melanin hyperpigmentation: Secondary | ICD-10-CM | POA: Diagnosis not present

## 2021-12-01 DIAGNOSIS — D225 Melanocytic nevi of trunk: Secondary | ICD-10-CM | POA: Diagnosis not present

## 2021-12-01 DIAGNOSIS — D2239 Melanocytic nevi of other parts of face: Secondary | ICD-10-CM | POA: Diagnosis not present

## 2022-01-05 DIAGNOSIS — R0982 Postnasal drip: Secondary | ICD-10-CM | POA: Diagnosis not present

## 2022-01-05 DIAGNOSIS — R059 Cough, unspecified: Secondary | ICD-10-CM | POA: Diagnosis not present

## 2022-01-05 DIAGNOSIS — J309 Allergic rhinitis, unspecified: Secondary | ICD-10-CM | POA: Diagnosis not present

## 2022-01-05 DIAGNOSIS — R070 Pain in throat: Secondary | ICD-10-CM | POA: Diagnosis not present

## 2022-04-13 ENCOUNTER — Telehealth: Payer: Self-pay | Admitting: Gastroenterology

## 2022-04-13 MED ORDER — HYDROCORTISONE (PERIANAL) 2.5 % EX CREA: 1.0000 | TOPICAL_CREAM | Freq: Two times a day (BID) | CUTANEOUS | 0 refills | Status: AC

## 2022-04-13 NOTE — Telephone Encounter (Signed)
Patient was prescribed hydrocortisone and needs it to be sent to new drug store, [place]. Please advise

## 2022-04-13 NOTE — Telephone Encounter (Signed)
Done patient needs an ov for more refills

## 2022-05-03 DIAGNOSIS — M810 Age-related osteoporosis without current pathological fracture: Secondary | ICD-10-CM | POA: Diagnosis not present

## 2022-05-03 DIAGNOSIS — E871 Hypo-osmolality and hyponatremia: Secondary | ICD-10-CM | POA: Diagnosis not present

## 2022-05-03 DIAGNOSIS — Z23 Encounter for immunization: Secondary | ICD-10-CM | POA: Diagnosis not present

## 2022-05-03 DIAGNOSIS — Z9181 History of falling: Secondary | ICD-10-CM | POA: Diagnosis not present

## 2022-05-03 DIAGNOSIS — Z1331 Encounter for screening for depression: Secondary | ICD-10-CM | POA: Diagnosis not present

## 2022-05-03 DIAGNOSIS — R7301 Impaired fasting glucose: Secondary | ICD-10-CM | POA: Diagnosis not present

## 2022-05-03 DIAGNOSIS — I10 Essential (primary) hypertension: Secondary | ICD-10-CM | POA: Diagnosis not present

## 2022-05-23 DIAGNOSIS — R0981 Nasal congestion: Secondary | ICD-10-CM | POA: Diagnosis not present

## 2022-05-23 DIAGNOSIS — R059 Cough, unspecified: Secondary | ICD-10-CM | POA: Diagnosis not present

## 2022-05-23 DIAGNOSIS — J209 Acute bronchitis, unspecified: Secondary | ICD-10-CM | POA: Diagnosis not present

## 2022-05-23 DIAGNOSIS — R070 Pain in throat: Secondary | ICD-10-CM | POA: Diagnosis not present

## 2022-06-24 DIAGNOSIS — K080 Exfoliation of teeth due to systemic causes: Secondary | ICD-10-CM | POA: Diagnosis not present

## 2022-08-12 DIAGNOSIS — S81801A Unspecified open wound, right lower leg, initial encounter: Secondary | ICD-10-CM | POA: Diagnosis not present

## 2022-10-27 IMAGING — CR DG LUMBAR SPINE COMPLETE 4+V
5 series · 5 of 5 positions shown · non-contrast
Comparison: None.

CLINICAL DATA: Pain in the LEFT side of the back after falling onto
the floor 3 months ago.

EXAM:
LUMBAR SPINE - COMPLETE 4+ VIEW

[t lumbar spine ap]
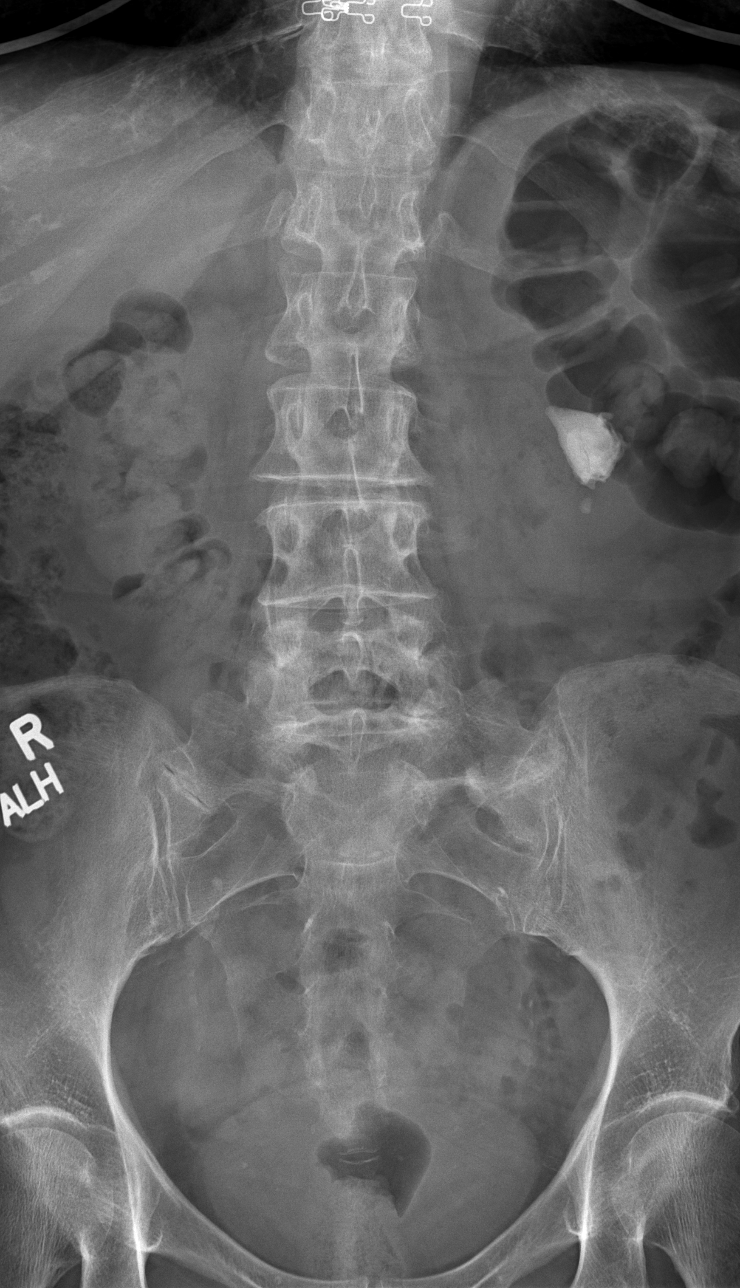

[t lumbar spine obl (1 of 2)]
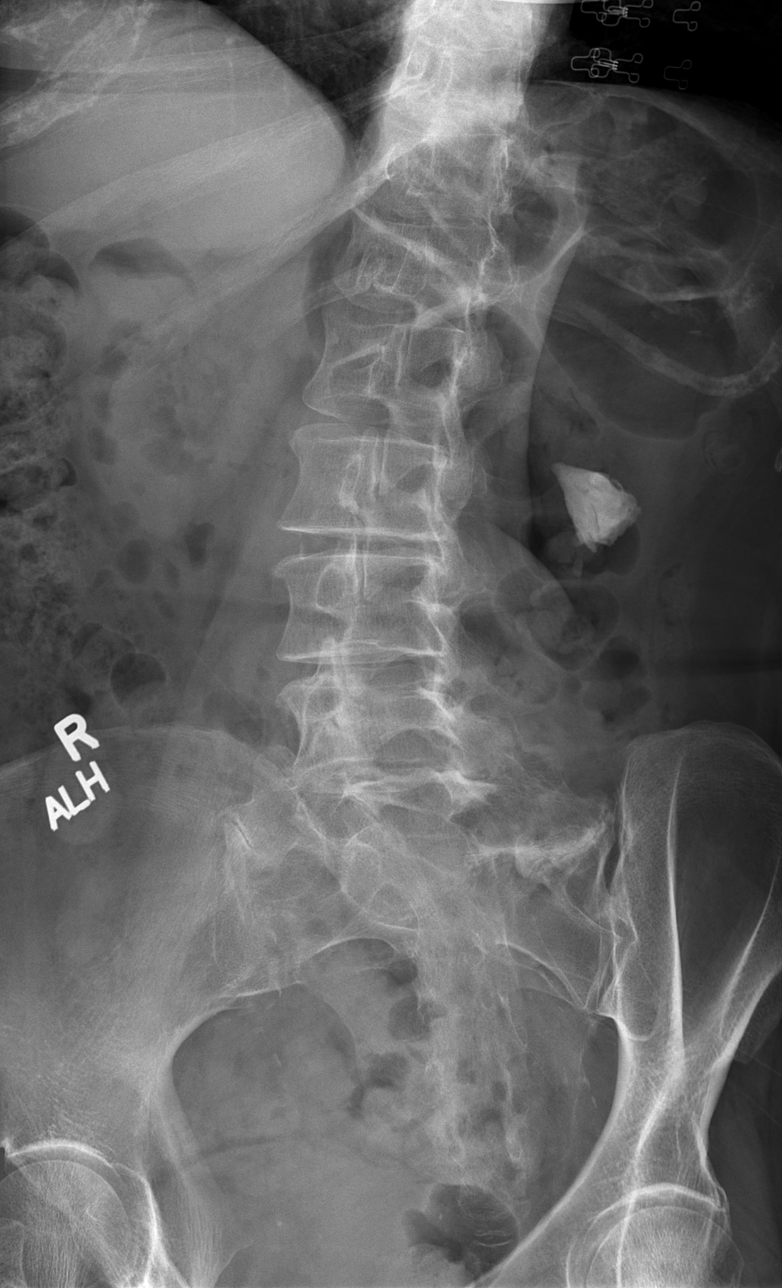

[t lumbar spine obl (2 of 2)]
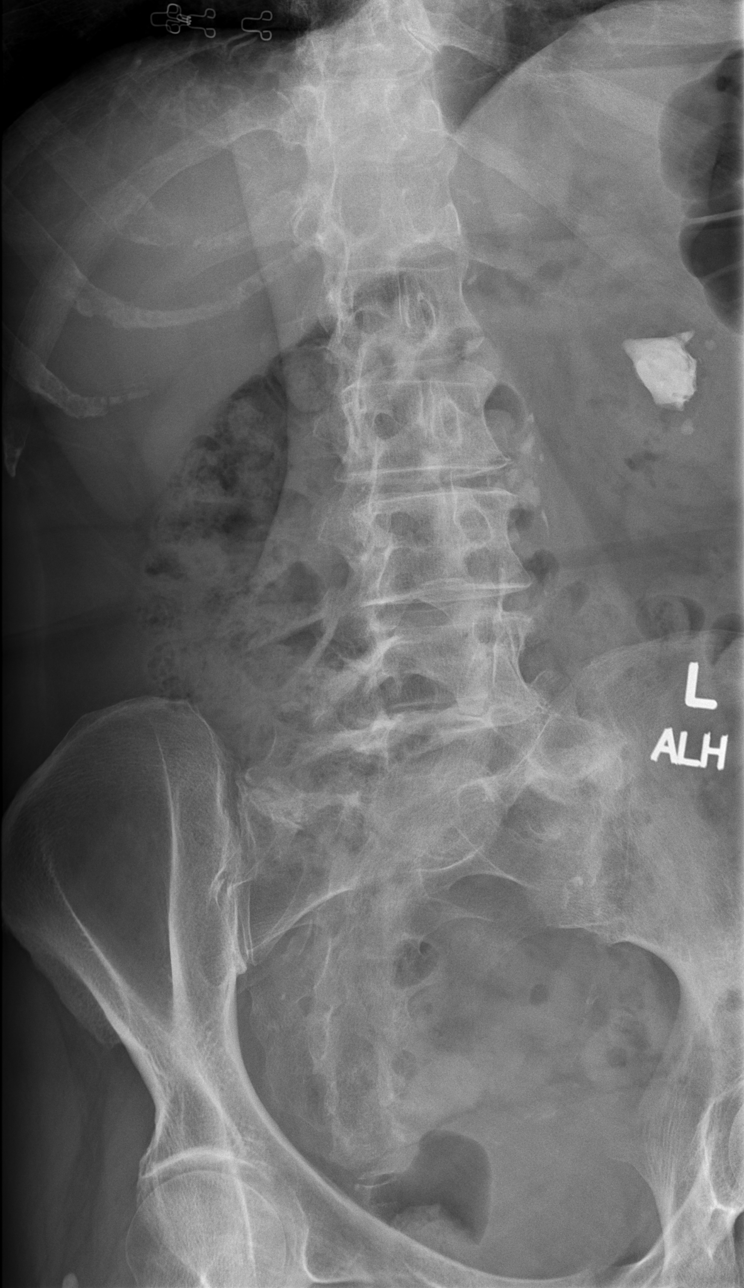

[t lumbar spine lat]
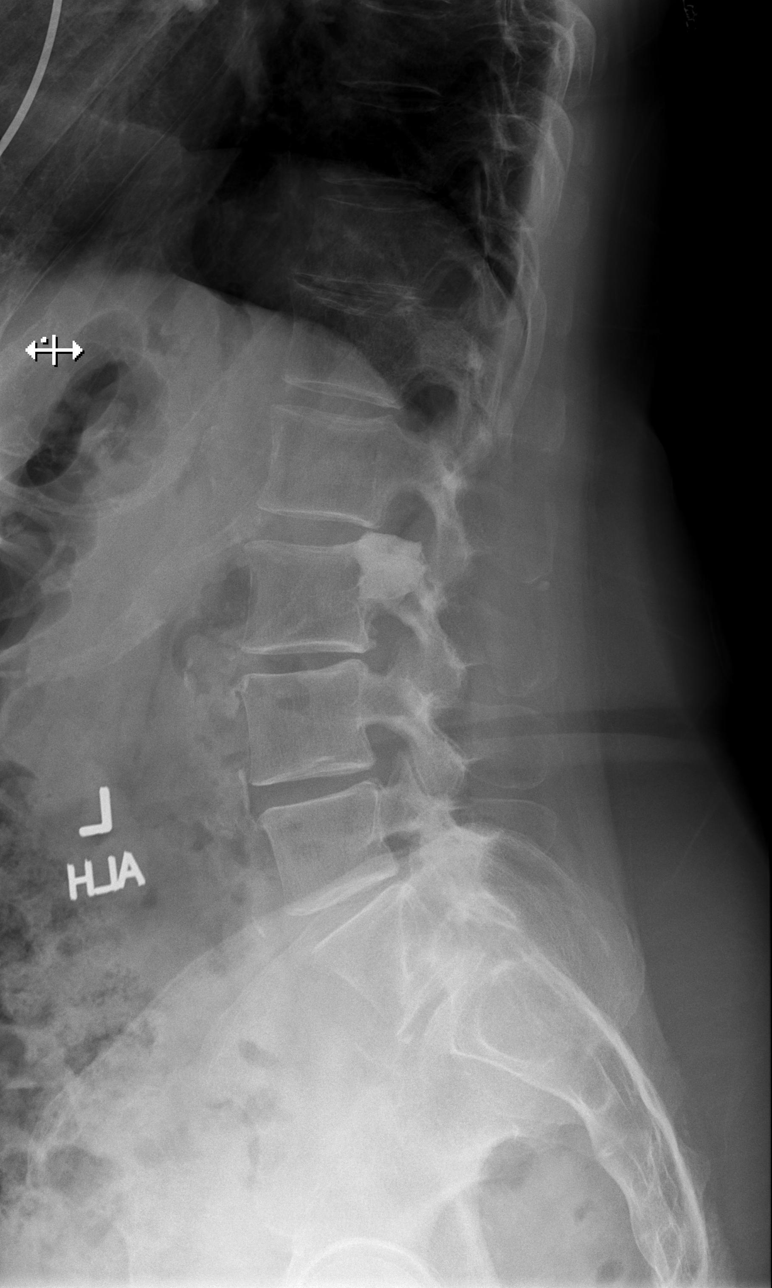

[t lumbar l-5 s-1 spot]
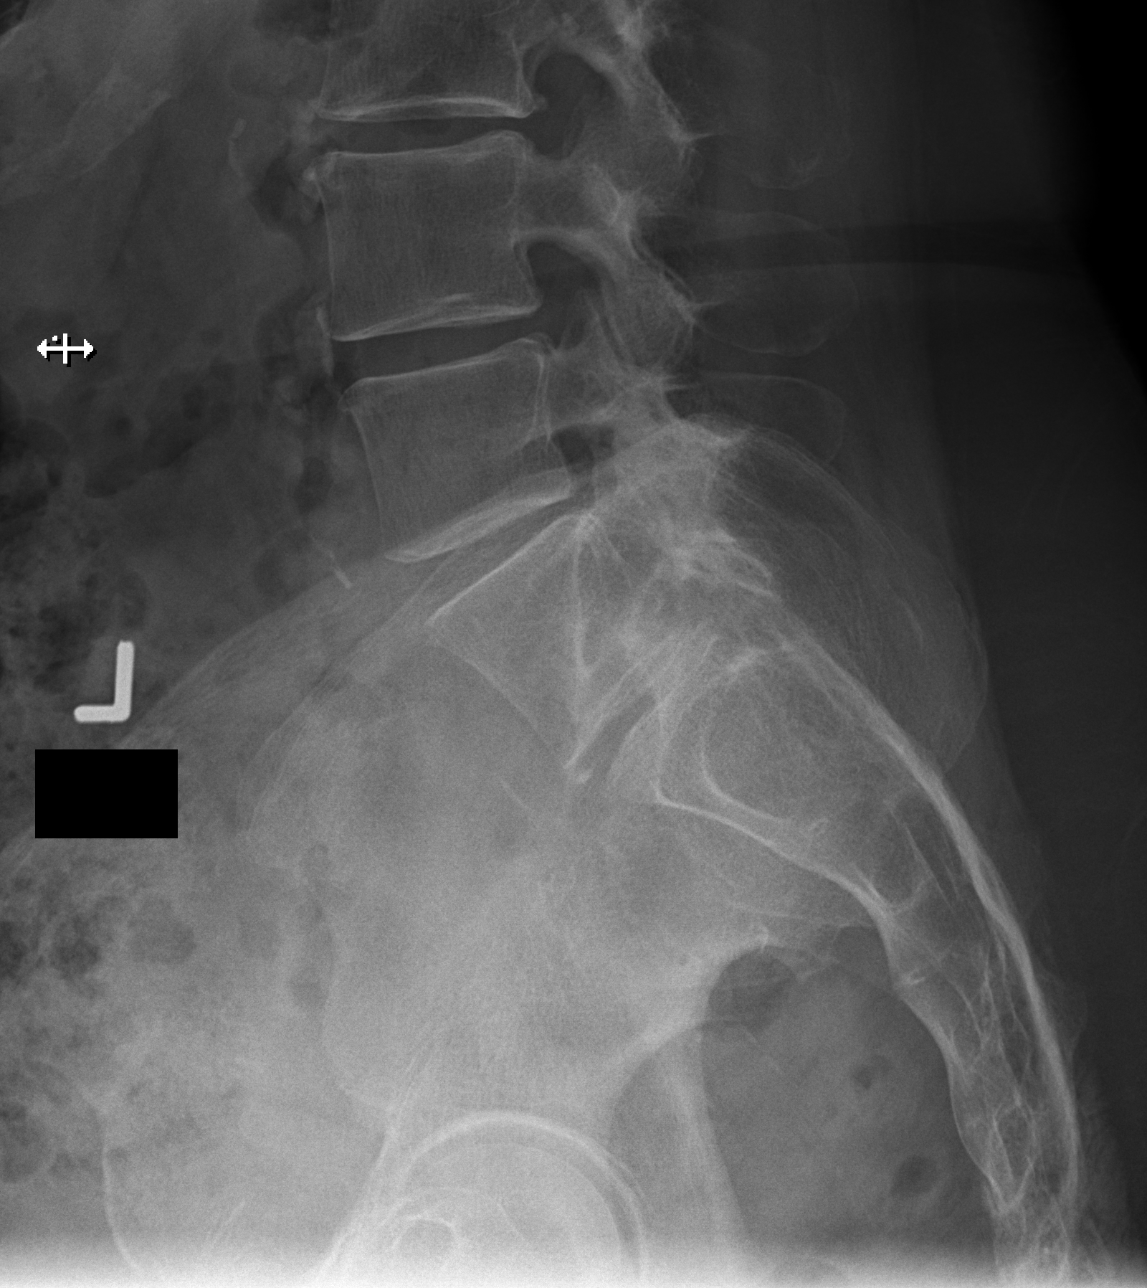

[5 of 5 positions shown; findings below may reference images not displayed]

FINDINGS: There is normal alignment of the lumbar spine. There is no acute
fracture or subluxation. Mild facet hypertrophy noted in the LOWER
lumbar levels. There is transitional anatomy at the lumbosacral
junction. No lytic or blastic lesions.

There is atherosclerotic calcification of the abdominal aorta.
Coarse calcification measuring 3.0 x 2.0 centimeters overlies the
posterior LEFT UPPER QUADRANT, possibly within the LEFT kidney.
IMPRESSION: 1. No evidence for acute abnormality of the lumbar spine.
2. Large calcification LEFT UPPER QUADRANT may be related to LEFT
kidney.

## 2022-11-02 DIAGNOSIS — R7301 Impaired fasting glucose: Secondary | ICD-10-CM | POA: Diagnosis not present

## 2022-11-02 DIAGNOSIS — E871 Hypo-osmolality and hyponatremia: Secondary | ICD-10-CM | POA: Diagnosis not present

## 2022-11-02 DIAGNOSIS — I10 Essential (primary) hypertension: Secondary | ICD-10-CM | POA: Diagnosis not present

## 2022-11-02 DIAGNOSIS — Z87891 Personal history of nicotine dependence: Secondary | ICD-10-CM | POA: Diagnosis not present

## 2022-11-02 DIAGNOSIS — M810 Age-related osteoporosis without current pathological fracture: Secondary | ICD-10-CM | POA: Diagnosis not present

## 2022-11-18 DIAGNOSIS — Z01419 Encounter for gynecological examination (general) (routine) without abnormal findings: Secondary | ICD-10-CM | POA: Diagnosis not present

## 2022-11-18 DIAGNOSIS — Z8262 Family history of osteoporosis: Secondary | ICD-10-CM | POA: Diagnosis not present

## 2022-11-18 DIAGNOSIS — Z6824 Body mass index (BMI) 24.0-24.9, adult: Secondary | ICD-10-CM | POA: Diagnosis not present

## 2022-11-18 DIAGNOSIS — Z1231 Encounter for screening mammogram for malignant neoplasm of breast: Secondary | ICD-10-CM | POA: Diagnosis not present

## 2022-11-18 DIAGNOSIS — F17210 Nicotine dependence, cigarettes, uncomplicated: Secondary | ICD-10-CM | POA: Diagnosis not present

## 2022-11-18 DIAGNOSIS — M8588 Other specified disorders of bone density and structure, other site: Secondary | ICD-10-CM | POA: Diagnosis not present

## 2022-11-24 DIAGNOSIS — M79661 Pain in right lower leg: Secondary | ICD-10-CM | POA: Diagnosis not present

## 2022-11-24 DIAGNOSIS — S76391A Other specified injury of muscle, fascia and tendon of the posterior muscle group at thigh level, right thigh, initial encounter: Secondary | ICD-10-CM | POA: Diagnosis not present

## 2022-11-24 DIAGNOSIS — M79604 Pain in right leg: Secondary | ICD-10-CM | POA: Diagnosis not present

## 2022-12-27 DIAGNOSIS — H6121 Impacted cerumen, right ear: Secondary | ICD-10-CM | POA: Diagnosis not present

## 2022-12-27 DIAGNOSIS — Z23 Encounter for immunization: Secondary | ICD-10-CM | POA: Diagnosis not present

## 2022-12-27 DIAGNOSIS — H9191 Unspecified hearing loss, right ear: Secondary | ICD-10-CM | POA: Diagnosis not present

## 2023-01-04 DIAGNOSIS — H60391 Other infective otitis externa, right ear: Secondary | ICD-10-CM | POA: Diagnosis not present

## 2023-01-27 DIAGNOSIS — K080 Exfoliation of teeth due to systemic causes: Secondary | ICD-10-CM | POA: Diagnosis not present

## 2023-02-01 DIAGNOSIS — J069 Acute upper respiratory infection, unspecified: Secondary | ICD-10-CM | POA: Diagnosis not present

## 2023-03-01 DIAGNOSIS — M25561 Pain in right knee: Secondary | ICD-10-CM | POA: Diagnosis not present

## 2023-03-02 DIAGNOSIS — Z9181 History of falling: Secondary | ICD-10-CM | POA: Diagnosis not present

## 2023-03-02 DIAGNOSIS — Z0000 Encounter for general adult medical examination without abnormal findings: Secondary | ICD-10-CM | POA: Diagnosis not present

## 2023-03-07 DIAGNOSIS — R0981 Nasal congestion: Secondary | ICD-10-CM | POA: Diagnosis not present

## 2023-03-07 DIAGNOSIS — R059 Cough, unspecified: Secondary | ICD-10-CM | POA: Diagnosis not present

## 2023-03-10 DIAGNOSIS — M25561 Pain in right knee: Secondary | ICD-10-CM | POA: Diagnosis not present

## 2023-03-14 DIAGNOSIS — J309 Allergic rhinitis, unspecified: Secondary | ICD-10-CM | POA: Diagnosis not present

## 2023-03-14 DIAGNOSIS — R051 Acute cough: Secondary | ICD-10-CM | POA: Diagnosis not present

## 2023-03-14 DIAGNOSIS — R0981 Nasal congestion: Secondary | ICD-10-CM | POA: Diagnosis not present

## 2023-03-22 DIAGNOSIS — M25561 Pain in right knee: Secondary | ICD-10-CM | POA: Diagnosis not present

## 2023-03-28 DIAGNOSIS — J069 Acute upper respiratory infection, unspecified: Secondary | ICD-10-CM | POA: Diagnosis not present

## 2023-04-05 DIAGNOSIS — M25561 Pain in right knee: Secondary | ICD-10-CM | POA: Diagnosis not present

## 2023-04-07 DIAGNOSIS — M25561 Pain in right knee: Secondary | ICD-10-CM | POA: Diagnosis not present

## 2023-05-03 DIAGNOSIS — I10 Essential (primary) hypertension: Secondary | ICD-10-CM | POA: Diagnosis not present

## 2023-05-03 DIAGNOSIS — M810 Age-related osteoporosis without current pathological fracture: Secondary | ICD-10-CM | POA: Diagnosis not present

## 2023-05-03 DIAGNOSIS — E871 Hypo-osmolality and hyponatremia: Secondary | ICD-10-CM | POA: Diagnosis not present

## 2023-05-03 DIAGNOSIS — R7301 Impaired fasting glucose: Secondary | ICD-10-CM | POA: Diagnosis not present

## 2023-05-30 DIAGNOSIS — C44729 Squamous cell carcinoma of skin of left lower limb, including hip: Secondary | ICD-10-CM | POA: Diagnosis not present

## 2023-05-30 DIAGNOSIS — L570 Actinic keratosis: Secondary | ICD-10-CM | POA: Diagnosis not present

## 2023-05-30 DIAGNOSIS — R233 Spontaneous ecchymoses: Secondary | ICD-10-CM | POA: Diagnosis not present

## 2023-06-16 DIAGNOSIS — C44729 Squamous cell carcinoma of skin of left lower limb, including hip: Secondary | ICD-10-CM | POA: Diagnosis not present

## 2023-09-02 DIAGNOSIS — C44729 Squamous cell carcinoma of skin of left lower limb, including hip: Secondary | ICD-10-CM | POA: Diagnosis not present

## 2023-09-02 DIAGNOSIS — L578 Other skin changes due to chronic exposure to nonionizing radiation: Secondary | ICD-10-CM | POA: Diagnosis not present

## 2023-09-02 DIAGNOSIS — L570 Actinic keratosis: Secondary | ICD-10-CM | POA: Diagnosis not present

## 2023-09-02 DIAGNOSIS — R233 Spontaneous ecchymoses: Secondary | ICD-10-CM | POA: Diagnosis not present

## 2023-09-02 DIAGNOSIS — L739 Follicular disorder, unspecified: Secondary | ICD-10-CM | POA: Diagnosis not present

## 2023-10-11 ENCOUNTER — Ambulatory Visit (HOSPITAL_BASED_OUTPATIENT_CLINIC_OR_DEPARTMENT_OTHER)
Admission: EM | Admit: 2023-10-11 | Discharge: 2023-10-11 | Disposition: A | Discharge status: Home / Self Care | Attending: Family Medicine | Admitting: Family Medicine

## 2023-10-11 ENCOUNTER — Ambulatory Visit (INDEPENDENT_AMBULATORY_CARE_PROVIDER_SITE_OTHER): Admit: 2023-10-11 | Discharge: 2023-10-11 | Disposition: A | Discharge status: Home / Self Care | Admitting: Radiology

## 2023-10-11 ENCOUNTER — Encounter (HOSPITAL_BASED_OUTPATIENT_CLINIC_OR_DEPARTMENT_OTHER): Payer: Self-pay

## 2023-10-11 DIAGNOSIS — M25472 Effusion, left ankle: Secondary | ICD-10-CM | POA: Diagnosis not present

## 2023-10-11 DIAGNOSIS — M25572 Pain in left ankle and joints of left foot: Secondary | ICD-10-CM

## 2023-10-11 MED ORDER — CEPHALEXIN 500 MG PO CAPS: 500.0000 mg | ORAL_CAPSULE | Freq: Four times a day (QID) | ORAL | 0 refills | Status: AC

## 2023-10-11 MED ORDER — CEPHALEXIN 500 MG PO CAPS: 500.0000 mg | ORAL_CAPSULE | Freq: Four times a day (QID) | ORAL | 0 refills | Status: DC

## 2023-10-11 NOTE — ED Provider Notes (Signed)
 [facility]    CSN: [id number] Arrival date & time: 10/11/23  1731      History   Chief Complaint Chief Complaint  Patient presents with   Ankle Pain    HPI Yesenia Torres is a 68 y.o. female.   68 year old female that presents for eval of left ankle swelling/redness. States only known injury was back in June after rolling ankle. Has been fine until recently. Approx 1 week ago, noted redness and swelling to left ankle. Played pickle ball today and had several  twinges of pain. Concerned maybe a small fracture.Took tylenol at 1300 today.    Ankle Pain   Past Medical History:  Diagnosis Date   High blood pressure    Osteopetrosis     Patient Active Problem List   Diagnosis Date Noted   Internal hemorrhoids 01/22/2021   Rectal bleeding 01/22/2021    Past Surgical History:  Procedure Laterality Date   BREAST ENHANCEMENT SURGERY     TUBAL LIGATION      OB History   No obstetric history on file.      Home Medications    Prior to Admission medications   Medication Sig Start Date End Date Taking? Authorizing Provider  acetaminophen (TYLENOL) 500 MG tablet Take 500 mg by mouth every 6 (six) hours as needed.    [provider]  alendronate (FOSAMAX) 70 MG tablet Take 70 mg by mouth once a week. 08/20/20   [provider]  Calcium Carb-Cholecalciferol (CALCIUM 500/D) 500-400 MG-UNIT CHEW Chew 1 tablet by mouth daily.    [provider]  cephALEXin  (KEFLEX ) 500 MG capsule Take 1 capsule (500 mg total) by mouth 4 (four) times daily. 10/11/23   Adah Corning A, FNP  Cholecalciferol (VITAMIN D3) 125 MCG (5000 UT) TABS Take 1 tablet by mouth daily.    [provider]  hydrocortisone  (ANUSOL -HC) 2.5 % rectal cream Place 1 Application rectally 2 (two) times daily. Please call [phone] to schedule an office visit for more refills 04/13/22   Charlanne Groom, MD  olmesartan-hydrochlorothiazide (BENICAR HCT) 40-12.5 MG tablet Take 1  tablet by mouth daily. 08/28/20   [provider]    Family History Family History  Problem Relation Age of Onset   Crohn's disease Mother    Diabetes Mother    Ulcerative colitis Brother    Diabetes Brother    Pancreatic cancer Paternal Grandmother    Colon cancer Neg Hx    Esophageal cancer Neg Hx    Liver disease Neg Hx    Stomach cancer Neg Hx     Social History Social History   Tobacco Use   Smoking status: Some Days    Types: Cigarettes   Smokeless tobacco: Never  Vaping Use   Vaping status: Never Used  Substance Use Topics   Alcohol use: Yes   Drug use: Never     Allergies   Patient has no known allergies.   Review of Systems Review of Systems See HPI  Physical Exam Triage Vital Signs ED Triage Vitals  Encounter Vitals Group     BP 10/11/23 1737 (!) 155/82     Girls Systolic BP Percentile --      Girls Diastolic BP Percentile --      Boys Systolic BP Percentile --      Boys Diastolic BP Percentile --      Pulse Rate 10/11/23 1737 71     Resp 10/11/23 1737 20     Temp  10/11/23 1737 98.5 F (36.9 C)     Temp Source 10/11/23 1737 Oral     SpO2 10/11/23 1737 98 %     Weight --      Height --      Head Circumference --      Peak Flow --      Pain Score 10/11/23 1741 5     Pain Loc --      Pain Education --      Exclude from Growth Chart --    No data found.  Updated Vital Signs BP (!) 155/82 (BP Location: Right Arm)   Pulse 71   Temp 98.5 F (36.9 C) (Oral)   Resp 20   SpO2 98%   Visual Acuity Right Eye Distance:   Left Eye Distance:   Bilateral Distance:    Right Eye Near:   Left Eye Near:    Bilateral Near:     Physical Exam Vitals and nursing note reviewed.  Constitutional:      General: She is not in acute distress.    Appearance: Normal appearance. She is not ill-appearing, toxic-appearing or diaphoretic.  Pulmonary:     Effort: Pulmonary effort is normal.  Skin:    General: Skin is warm and dry.     Findings:  Erythema present.     Comments: Swelling and erythema  noted to LLL near ankle . Abrasion noted as well to lower extremity above the erythema   Neurological:     Mental Status: She is alert.  Psychiatric:        Mood and Affect: Mood normal.      UC Treatments / Results  Labs (all labs ordered are listed, but only abnormal results are displayed) Labs Reviewed - No data to display  EKG   Radiology DG Ankle Complete Left Result Date: 10/11/2023 CLINICAL DATA:  Left ankle pain and swelling after rolling injury 1 month ago. EXAM: LEFT ANKLE COMPLETE - 3+ VIEW COMPARISON:  September 11, 2013. FINDINGS: There is no evidence of fracture, dislocation, or joint effusion. There is no evidence of arthropathy or other focal bone abnormality. Soft tissues are unremarkable. IMPRESSION: Negative. Electronically Signed   By: Lynwood Landy Raddle M.D.   On: 10/11/2023 18:30    Procedures Procedures (including critical care time)  Medications Ordered in UC Medications - No data to display  Initial Impression / Assessment and Plan / UC Course  I have reviewed the triage vital signs and the nursing notes.  Pertinent labs & imaging results that were available during my care of the patient were reviewed by me and considered in my medical decision making (see chart for details).     Ankle pain/swelling- x ray normal. Believe she has an infection and mild cellulitis from the wound. Treating with keflex .  Recommend F/U if problem worsens or does not resolve.  Final Clinical Impressions(s) / UC Diagnoses   Final diagnoses:  Pain of joint of left ankle and foot     Discharge Instructions      I am treating you for infection.  Take the antibiotic as prescribed.  Your x-ray did not show any concerns.  Follow-up as needed    ED Prescriptions     Medication Sig Dispense Auth. Provider   cephALEXin  (KEFLEX ) 500 MG capsule  (Status: Discontinued) Take 1 capsule (500 mg total) by mouth 4 (four) times  daily. 28 capsule Naren Benally A, FNP   cephALEXin  (KEFLEX ) 500 MG capsule Take 1 capsule (500  mg total) by mouth 4 (four) times daily. 28 capsule Adah Corning A, FNP      PDMP not reviewed this encounter.   Adah Corning LABOR, FNP 10/11/23 1901

## 2023-10-11 NOTE — ED Triage Notes (Signed)
 Presents for eval of left ankle swelling/redness. States only known injury was back in June after rolling ankle. Has been fine until recently. Approx 1 week ago, noted redness and swelling to left ankle. Played pickle ball today and had several  twinges of pain. Concerned maybe a small fracture.Took tylenol at 1300 today. + swelling and redness noted to LLL. Abrasion noted as well to lower extremity.

## 2023-10-11 NOTE — Discharge Instructions (Addendum)
 I am treating you for infection.  Take the antibiotic as prescribed.  Your x-ray did not show any concerns.  Follow-up as needed
